# Patient Record
Sex: Male | Born: 1990 | Hispanic: No | Marital: Married | Smoking: Never smoker
Health system: Southern US, Community
[De-identification: ages and names within clinical notes are randomized; demographics above are authoritative.]

## PROBLEM LIST (undated history)

## (undated) DIAGNOSIS — L309 Dermatitis, unspecified: Secondary | ICD-10-CM

## (undated) DIAGNOSIS — Q892 Congenital malformations of other endocrine glands: Secondary | ICD-10-CM

## (undated) DIAGNOSIS — M279 Disease of jaws, unspecified: Secondary | ICD-10-CM

## (undated) HISTORY — PX: NO PAST SURGERIES: SHX2092

---

## 2017-04-16 ENCOUNTER — Emergency Department (HOSPITAL_COMMUNITY)
Admission: EM | Admit: 2017-04-16 | Discharge: 2017-04-16 | Disposition: A | Discharge status: Home / Self Care | Payer: Medicaid Other | Attending: Emergency Medicine | Admitting: Emergency Medicine

## 2017-04-16 ENCOUNTER — Encounter (HOSPITAL_COMMUNITY): Payer: Self-pay | Admitting: *Deleted

## 2017-04-16 ENCOUNTER — Other Ambulatory Visit: Payer: Self-pay

## 2017-04-16 DIAGNOSIS — H66011 Acute suppurative otitis media with spontaneous rupture of ear drum, right ear: Secondary | ICD-10-CM | POA: Diagnosis not present

## 2017-04-16 DIAGNOSIS — H66001 Acute suppurative otitis media without spontaneous rupture of ear drum, right ear: Secondary | ICD-10-CM

## 2017-04-16 DIAGNOSIS — H9201 Otalgia, right ear: Secondary | ICD-10-CM

## 2017-04-16 MED ORDER — IBUPROFEN 800 MG PO TABS
800.0000 mg | ORAL_TABLET | Freq: Once | ORAL | Status: AC
  Administered 2017-04-16: 800 mg via ORAL
  Filled 2017-04-16: qty 1

## 2017-04-16 MED ORDER — AMOXICILLIN 500 MG PO CAPS: 500.0000 mg | ORAL_CAPSULE | Freq: Three times a day (TID) | ORAL | 0 refills | Status: DC

## 2017-04-16 MED ORDER — AMOXICILLIN 500 MG PO CAPS
500.0000 mg | ORAL_CAPSULE | Freq: Once | ORAL | Status: AC
  Administered 2017-04-16: 500 mg via ORAL
  Filled 2017-04-16: qty 1

## 2017-04-16 NOTE — Discharge Instructions (Signed)
1. Medications: Amoxicillin, usual home medications 2. Treatment: rest, drink plenty of fluids, alternate tylenol and ibuprofen for pain control 3. Follow Up: Please followup with your primary doctor in 2-3 days for discussion of your diagnoses and further evaluation after today's visit; if you do not have a primary care doctor use the resource guide provided to find one; Please return to the ER for worsening pain, bleeding from the ear, high fevers or other concerns

## 2017-04-16 NOTE — ED Notes (Signed)
Pt understood dc material. NAD noted. Script and work excuse given at dc 

## 2017-04-16 NOTE — ED Triage Notes (Signed)
Rt earache since last pm  No known injury

## 2017-04-16 NOTE — ED Provider Notes (Signed)
[facility] EMERGENCY [facility]PARTMENT Provider Note   CSN: [id number] Arrival date & time: 04/16/17  0155     History   Chief Complaint Chief Complaint  Patient presents with  . Otalgia    HPI Paul Nicholson is a 26 y.o. male with a hx of no major medical problems presents to the Emergency Department complaining of gradual, persistent, progressively worsening R otalgia onset several hours prior to arrival.  Pt denies fever, chills, neck pain, neck stiffness, rash, lymphadenopathy, nausea, vomiting, vision changes, dental pain.  Associated symptoms include right sided headache. No treatments PTA.  Pt denies hx of HIV or diabetes.  Denies recent URI symptoms.    The history is provided by the patient and medical records. No language interpreter was used.    History reviewed. No pertinent past medical history.  There are no active problems to display for this patient.   History reviewed. No pertinent surgical history.     Home Medications    Prior to Admission medications   Medication Sig Start Date End Date Taking? Authorizing Provider  amoxicillin (AMOXIL) 500 MG capsule Take 1 capsule (500 mg total) by mouth 3 (three) times daily. 04/16/17   Isacc Turney, Dahlia ClientHannah, PA-C    Family History No family history on file.  Social History Social History   Tobacco Use  . Smoking status: Never Smoker  . Smokeless tobacco: Never Used  Substance Use Topics  . Alcohol use: No    Frequency: Never  . Drug use: Not on file     Allergies   Patient has no known allergies.   Review of Systems Review of Systems  Constitutional: Positive for chills. Negative for fatigue and fever.  HENT: Positive for ear pain. Negative for congestion, facial swelling, postnasal drip, rhinorrhea, sinus pain and sneezing.   Eyes: Negative for visual disturbance.  Respiratory: Negative for cough, chest tightness and shortness of breath.   Cardiovascular: Negative for chest pain.    Gastrointestinal: Negative for nausea and vomiting.  Musculoskeletal: Negative for neck pain and neck stiffness.  Skin: Negative for rash.  Allergic/Immunologic: Negative for immunocompromised state.  Neurological: Negative for facial asymmetry.  Hematological: Negative for adenopathy.  Psychiatric/Behavioral: Negative for confusion.     Physical Exam Updated Vital Signs BP (!) 149/95 (BP Location: Right Arm)   Pulse (!) 44   Temp 98.1 F (36.7 C) (Oral)   Resp 18   Ht 5\' 7"  (1.702 m)   Wt 99.8 kg (220 lb)   SpO2 100%   BMI 34.46 kg/m   Physical Exam  Constitutional: He appears well-developed and well-nourished. No distress.  HENT:  Head: Normocephalic and atraumatic.  Right Ear: External ear and ear canal normal. Tympanic membrane is injected, erythematous and bulging. A middle ear effusion is present. No hemotympanum.  Left Ear: Tympanic membrane, external ear and ear canal normal. Tympanic membrane is not injected, not erythematous and not bulging.  No middle ear effusion.  Nose: Mucosal edema and rhinorrhea present. No epistaxis. Right sinus exhibits no maxillary sinus tenderness and no frontal sinus tenderness. Left sinus exhibits no maxillary sinus tenderness and no frontal sinus tenderness.  Mouth/Throat: Uvula is midline and mucous membranes are normal. Mucous membranes are not pale and not cyanotic. No oropharyngeal exudate, posterior oropharyngeal edema, posterior oropharyngeal erythema or tonsillar abscesses.  Eyes: Conjunctivae are normal. Pupils are equal, round, and reactive to light.  Neck: Normal range of motion and full passive range of motion without pain.  Cardiovascular: Normal rate  and intact distal pulses.  Pulmonary/Chest: Effort normal and breath sounds normal. No stridor.  Clear and equal breath sounds without focal wheezes, rhonchi, rales  Abdominal: Soft. There is no tenderness.  Musculoskeletal: Normal range of motion.  Lymphadenopathy:    He has  no cervical adenopathy.  Neurological: He is alert.  Skin: Skin is warm and dry. No rash noted. He is not diaphoretic.  Psychiatric: He has a normal mood and affect.  Nursing note and vitals reviewed.    ED Treatments / Results   Procedures Procedures (including critical care time)  Medications Ordered in ED Medications  amoxicillin (AMOXIL) capsule 500 mg (500 mg Oral Given 04/16/17 0353)  ibuprofen (ADVIL,MOTRIN) tablet 800 mg (800 mg Oral Given 04/16/17 0353)     Initial Impression / Assessment and Plan / ED Course  I have reviewed the triage vital signs and the nursing notes.  Pertinent labs & imaging results that were available during my care of the patient were reviewed by me and considered in my medical decision making (see chart for details).     Patient presents with otalgia and exam consistent with acute otitis media. No concern for acute mastoiditis, meningitis.  No fever or signs of sepsis.  Advised patient to call PCP today for follow-up.  Will give amoxicillin.  I have also discussed reasons to return immediately to the ER.  Patient expresses understanding and agrees with plan.     Final Clinical Impressions(s) / ED Diagnoses   Final diagnoses:  Right ear pain  Acute suppurative otitis media of right ear without spontaneous rupture of tympanic membrane, recurrence not specified    ED Discharge Orders        Ordered    amoxicillin (AMOXIL) 500 MG capsule  3 times daily     04/16/17 0347       Anhad Sheeley, Dahlia ClientHannah, PA-C 04/16/17 0358    Gwyneth SproutPlunkett, Whitney, MD 04/16/17 [phone]

## 2017-04-26 ENCOUNTER — Emergency Department (HOSPITAL_COMMUNITY)
Admission: EM | Admit: 2017-04-26 | Discharge: 2017-04-26 | Disposition: A | Discharge status: Home / Self Care | Payer: Medicaid Other | Attending: Emergency Medicine | Admitting: Emergency Medicine

## 2017-04-26 ENCOUNTER — Encounter (HOSPITAL_COMMUNITY): Payer: Self-pay | Admitting: Emergency Medicine

## 2017-04-26 DIAGNOSIS — J029 Acute pharyngitis, unspecified: Secondary | ICD-10-CM | POA: Insufficient documentation

## 2017-04-26 LAB — RAPID STREP SCREEN (MED CTR MEBANE ONLY): STREPTOCOCCUS, GROUP A SCREEN (DIRECT): NEGATIVE

## 2017-04-26 MED ORDER — DEXAMETHASONE 1 MG/ML PO CONC
10.0000 mg | Freq: Once | ORAL | Status: AC
  Administered 2017-04-26: 10 mg via ORAL
  Filled 2017-04-26: qty 10

## 2017-04-26 MED ORDER — HYDROCODONE-ACETAMINOPHEN 7.5-325 MG/15ML PO SOLN: 10.0000 mL | Freq: Four times a day (QID) | ORAL | 0 refills | Status: DC | PRN

## 2017-04-26 MED ORDER — ACETAMINOPHEN 325 MG PO TABS
650.0000 mg | ORAL_TABLET | Freq: Once | ORAL | Status: AC | PRN
  Administered 2017-04-26: 650 mg via ORAL
  Filled 2017-04-26: qty 2

## 2017-04-26 NOTE — ED Notes (Signed)
ED Provider at bedside. 

## 2017-04-26 NOTE — Discharge Instructions (Signed)
Take pain medicine as needed Take benadryl if medicine makes you itchy Return if worsening

## 2017-04-26 NOTE — ED Notes (Signed)
Pt not in lobby when called to room.  

## 2017-04-26 NOTE — ED Provider Notes (Signed)
[facility] Note   CSN: [id number] Arrival date & time: 04/26/17  2021     History   Chief Complaint Chief Complaint  Patient presents with  . Sore Throat    HPI [name] is a 26 y.o. male who presents with a sore throat. He states that ~10 days ago he was treated for an ear infection. His symptoms initially improved after treatment. Today however, he started to have a sore throat. The pain has worsened throughout the day so he returned to make sure he didn't have strep throat. He states he still has a "squishy" feeling in his R ear and some L ear pressure as well. He also feels pain in the front of his throat, above his adam's apple, and behind his head. He denies runny nose, cough, abdominal pain. He denies sick contacts. He has not had a fever since he was initially diagnosed with an ear infection.  HPI  History reviewed. No pertinent past medical history.  There are no active problems to display for this patient.   History reviewed. No pertinent surgical history.     Home Medications    Prior to Admission medications   Medication Sig Start Date End Date Taking? Authorizing Provider  amoxicillin (AMOXIL) 500 MG capsule Take 1 capsule (500 mg total) by mouth 3 (three) times daily. 04/16/17   Muthersbaugh, Dahlia ClientHannah, PA-C    Family History History reviewed. No pertinent family history.  Social History Social History   Tobacco Use  . Smoking status: Never Smoker  . Smokeless tobacco: Never Used  Substance Use Topics  . Alcohol use: No    Frequency: Never  . Drug use: Not on file     Allergies   Oxycodone   Review of Systems Review of Systems  Constitutional: Negative for chills and fever.  HENT: Positive for ear pain and sore throat. Negative for congestion, ear discharge and rhinorrhea.   Respiratory: Negative for cough.   Gastrointestinal: Negative for abdominal pain.     Physical Exam Updated  Vital Signs BP (!) 144/94 (BP Location: Right Arm)   Pulse 81   Temp 99.7 F (37.6 C) (Oral)   Resp 18   SpO2 100%   Physical Exam  Constitutional: He is oriented to person, place, and time. He appears well-developed and well-nourished. No distress.  HENT:  Head: Normocephalic and atraumatic.  Right Ear: Hearing, external ear and ear canal normal. A middle ear effusion is present.  Left Ear: Hearing, tympanic membrane, external ear and ear canal normal.  Nose: Nose normal.  Mouth/Throat: Uvula is midline, oropharynx is clear and moist and mucous membranes are normal. No posterior oropharyngeal edema or posterior oropharyngeal erythema. No tonsillar exudate.  Eyes: Conjunctivae are normal. Pupils are equal, round, and reactive to light. Right eye exhibits no discharge. Left eye exhibits no discharge. No scleral icterus.  Neck: Normal range of motion.  Tender posterior cervical lymph nodes  Cardiovascular: Normal rate and regular rhythm. Exam reveals no gallop and no friction rub.  No murmur heard. Pulmonary/Chest: Effort normal. No stridor. No respiratory distress. He has no wheezes. He has no rales. He exhibits no tenderness.  Abdominal: He exhibits no distension.  Lymphadenopathy:    He has cervical adenopathy (bilateral).  Neurological: He is alert and oriented to person, place, and time.  Skin: Skin is warm and dry.  Psychiatric: He has a normal mood and affect. His behavior is normal.  Nursing note and vitals reviewed.  ED Treatments / Results  Labs (all labs ordered are listed, but only abnormal results are displayed) Labs Reviewed  RAPID STREP SCREEN (NOT AT [facility])  CULTURE, GROUP A STREP [facility]    EKG  EKG Interpretation None       Radiology No results found.  Procedures Procedures (including critical care time)  Medications Ordered in ED Medications  acetaminophen (TYLENOL) tablet 650 mg (650 mg Oral Given 04/26/17 2042)  dexamethasone (DECADRON) 1  MG/ML solution 10 mg (10 mg Oral Given 04/26/17 2319)     Initial Impression / Assessment and Plan / ED Course  I have reviewed the triage vital signs and the nursing notes.  Pertinent labs & imaging results that were available during my care of the patient were reviewed by me and considered in my medical decision making (see chart for details).  26 year old male presents with pharyngitis. Temp is slightly elevated and he is mildly hypertensive. He is not ill appearing but does seem to have discomfort. Exam is overall unremarkable. No evidence of PTA on exam. Rapid strep is negative. He was given a dose of Decadron and rx for Hycet. He was advised to return for worsening symptoms. He verbalized understanding.  Final Clinical Impressions(s) / ED Diagnoses   Final diagnoses:  Pharyngitis, unspecified etiology    ED Discharge Orders    None       Bethel BornGekas, Jesslynn Kruck Marie, PA-C 04/26/17 2355    Derwood KaplanNanavati, Ankit, MD 04/27/17 1459

## 2017-04-26 NOTE — ED Triage Notes (Signed)
Pt presents to ED for assessment of right throat pain, facial and throat swelling, and ear pain.  Pt states he was treated for an ear infection approx 2 weeks ago, completed all ABX.  Patient states too painful to swallow at this point.

## 2017-04-29 LAB — CULTURE, GROUP A STREP (THRC)

## 2017-05-11 ENCOUNTER — Other Ambulatory Visit: Payer: Self-pay

## 2017-05-11 ENCOUNTER — Encounter (HOSPITAL_COMMUNITY): Payer: Self-pay | Admitting: Emergency Medicine

## 2017-05-11 DIAGNOSIS — Z79899 Other long term (current) drug therapy: Secondary | ICD-10-CM | POA: Insufficient documentation

## 2017-05-11 DIAGNOSIS — R1310 Dysphagia, unspecified: Secondary | ICD-10-CM | POA: Diagnosis not present

## 2017-05-11 NOTE — ED Triage Notes (Signed)
Seen two weeks ago for a sore throat and swelling.  Reports sore throat is gone but still has a knot that you can feel as well as difficulty swallowing.

## 2017-05-12 ENCOUNTER — Emergency Department (HOSPITAL_COMMUNITY): Payer: Medicaid Other

## 2017-05-12 ENCOUNTER — Encounter (HOSPITAL_COMMUNITY): Payer: Self-pay | Admitting: Radiology

## 2017-05-12 ENCOUNTER — Emergency Department (HOSPITAL_COMMUNITY)
Admission: EM | Admit: 2017-05-12 | Discharge: 2017-05-12 | Disposition: A | Discharge status: Home / Self Care | Payer: Medicaid Other | Attending: Emergency Medicine | Admitting: Emergency Medicine

## 2017-05-12 DIAGNOSIS — R1310 Dysphagia, unspecified: Secondary | ICD-10-CM

## 2017-05-12 LAB — BASIC METABOLIC PANEL
Anion gap: 6 (ref 5–15)
BUN: 10 mg/dL (ref 6–20)
CHLORIDE: 104 mmol/L (ref 101–111)
CO2: 27 mmol/L (ref 22–32)
Calcium: 9.1 mg/dL (ref 8.9–10.3)
Creatinine, Ser: 0.89 mg/dL (ref 0.61–1.24)
GFR calc Af Amer: >60 mL/min (ref >60)
GFR calc non Af Amer: >60 mL/min (ref >60)
GLUCOSE: 106 mg/dL — AB (ref 65–99)
POTASSIUM: 3.8 mmol/L (ref 3.5–5.1)
Sodium: 137 mmol/L (ref 135–145)

## 2017-05-12 LAB — CBC WITH DIFFERENTIAL/PLATELET
Basophils Absolute: 0 10*3/uL (ref 0.0–0.1)
Basophils Relative: 0 %
EOS PCT: 5 %
Eosinophils Absolute: 0.2 10*3/uL (ref 0.0–0.7)
HCT: 41 % (ref 39.0–52.0)
HEMOGLOBIN: 13.3 g/dL (ref 13.0–17.0)
LYMPHS ABS: 2.3 10*3/uL (ref 0.7–4.0)
LYMPHS PCT: 49 %
MCH: 27.4 pg (ref 26.0–34.0)
MCHC: 32.4 g/dL (ref 30.0–36.0)
MCV: 84.5 fL (ref 78.0–100.0)
Monocytes Absolute: 0.4 10*3/uL (ref 0.1–1.0)
Monocytes Relative: 8 %
Neutro Abs: 1.8 10*3/uL (ref 1.7–7.7)
Neutrophils Relative %: 38 %
PLATELETS: 291 10*3/uL (ref 150–400)
RBC: 4.85 MIL/uL (ref 4.22–5.81)
RDW: 13.4 % (ref 11.5–15.5)
WBC: 4.6 10*3/uL (ref 4.0–10.5)

## 2017-05-12 MED ORDER — CLINDAMYCIN HCL 150 MG PO CAPS: 300.0000 mg | ORAL_CAPSULE | Freq: Four times a day (QID) | ORAL | 0 refills | Status: DC

## 2017-05-12 MED ORDER — IOPAMIDOL (ISOVUE-300) INJECTION 61%
INTRAVENOUS | Status: AC
  Administered 2017-05-12: 75 mL
  Filled 2017-05-12: qty 75
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
## 2017-05-12 NOTE — Discharge Instructions (Signed)
It was my pleasure taking care of you today!   Please take all of your antibiotics until finished!  Please call the ENT doctor listed (ear, nose and throat) first thing Monday morning to schedule a follow up appointment this week.   Return to ER for fever, inability to keep food/fluids down, new or worsening symptoms, any additional concerns.

## 2017-05-15 DIAGNOSIS — Q892 Congenital malformations of other endocrine glands: Secondary | ICD-10-CM

## 2017-05-15 HISTORY — DX: Congenital malformations of other endocrine glands: Q89.2

## 2017-05-17 ENCOUNTER — Ambulatory Visit (INDEPENDENT_AMBULATORY_CARE_PROVIDER_SITE_OTHER): Payer: Medicaid Other | Admitting: Otolaryngology

## 2017-05-17 DIAGNOSIS — R070 Pain in throat: Secondary | ICD-10-CM | POA: Diagnosis not present

## 2017-05-17 DIAGNOSIS — Q892 Congenital malformations of other endocrine glands: Secondary | ICD-10-CM | POA: Diagnosis not present

## 2017-05-18 ENCOUNTER — Ambulatory Visit: Payer: Medicaid Other | Admitting: Family Medicine

## 2017-05-18 ENCOUNTER — Encounter (HOSPITAL_BASED_OUTPATIENT_CLINIC_OR_DEPARTMENT_OTHER): Payer: Self-pay | Admitting: *Deleted

## 2017-05-18 ENCOUNTER — Other Ambulatory Visit: Payer: Self-pay

## 2017-05-21 ENCOUNTER — Other Ambulatory Visit: Payer: Self-pay | Admitting: Otolaryngology

## 2017-05-22 ENCOUNTER — Ambulatory Visit (HOSPITAL_BASED_OUTPATIENT_CLINIC_OR_DEPARTMENT_OTHER)
Admission: RE | Admit: 2017-05-22 | Discharge: 2017-05-22 | Disposition: A | Discharge status: Home / Self Care | Payer: Medicaid Other | Source: Ambulatory Visit | Attending: Otolaryngology | Admitting: Otolaryngology

## 2017-05-22 ENCOUNTER — Other Ambulatory Visit: Payer: Self-pay

## 2017-05-22 ENCOUNTER — Ambulatory Visit (HOSPITAL_BASED_OUTPATIENT_CLINIC_OR_DEPARTMENT_OTHER): Payer: Medicaid Other | Admitting: Anesthesiology

## 2017-05-22 ENCOUNTER — Encounter (HOSPITAL_BASED_OUTPATIENT_CLINIC_OR_DEPARTMENT_OTHER): Payer: Self-pay

## 2017-05-22 ENCOUNTER — Encounter (HOSPITAL_BASED_OUTPATIENT_CLINIC_OR_DEPARTMENT_OTHER)
Admission: RE | Disposition: A | Discharge status: Home / Self Care | Payer: Self-pay | Source: Ambulatory Visit | Attending: Otolaryngology

## 2017-05-22 DIAGNOSIS — E669 Obesity, unspecified: Secondary | ICD-10-CM | POA: Insufficient documentation

## 2017-05-22 DIAGNOSIS — Z6836 Body mass index (BMI) 36.0-36.9, adult: Secondary | ICD-10-CM | POA: Diagnosis not present

## 2017-05-22 DIAGNOSIS — Q892 Congenital malformations of other endocrine glands: Secondary | ICD-10-CM | POA: Insufficient documentation

## 2017-05-22 HISTORY — DX: Disease of jaws, unspecified: M27.9

## 2017-05-22 HISTORY — DX: Dermatitis, unspecified: L30.9

## 2017-05-22 HISTORY — PX: THYROGLOSSAL DUCT CYST: SHX297

## 2017-05-22 HISTORY — DX: Congenital malformations of other endocrine glands: Q89.2

## 2017-05-22 SURGERY — EXCISION, THYROGLOSSAL DUCT CYST
Anesthesia: General | Site: Neck

## 2017-05-22 MED ORDER — SUCCINYLCHOLINE CHLORIDE 200 MG/10ML IV SOSY
PREFILLED_SYRINGE | INTRAVENOUS | Status: AC
  Filled 2017-05-22: qty 10

## 2017-05-22 MED ORDER — PHENYLEPHRINE HCL 10 MG/ML IJ SOLN
INTRAMUSCULAR | Status: DC | PRN
  Administered 2017-05-22: 80 ug via INTRAVENOUS
  Administered 2017-05-22 (×2): 40 ug via INTRAVENOUS
  Administered 2017-05-22: 80 ug via INTRAVENOUS

## 2017-05-22 MED ORDER — ONDANSETRON HCL 4 MG/2ML IJ SOLN
INTRAMUSCULAR | Status: DC | PRN
  Administered 2017-05-22: 4 mg via INTRAVENOUS

## 2017-05-22 MED ORDER — EPHEDRINE 5 MG/ML INJ
INTRAVENOUS | Status: AC
  Filled 2017-05-22: qty 10

## 2017-05-22 MED ORDER — SCOPOLAMINE 1 MG/3DAYS TD PT72: 1.0000 | MEDICATED_PATCH | Freq: Once | TRANSDERMAL | Status: DC | PRN

## 2017-05-22 MED ORDER — LACTATED RINGERS IV SOLN
INTRAVENOUS | Status: DC
  Administered 2017-05-22 (×3): via INTRAVENOUS

## 2017-05-22 MED ORDER — CEFAZOLIN SODIUM-DEXTROSE 2-4 GM/100ML-% IV SOLN
INTRAVENOUS | Status: AC
  Filled 2017-05-22: qty 100

## 2017-05-22 MED ORDER — FENTANYL CITRATE (PF) 100 MCG/2ML IJ SOLN
INTRAMUSCULAR | Status: AC
  Filled 2017-05-22: qty 2

## 2017-05-22 MED ORDER — CEFAZOLIN SODIUM-DEXTROSE 2-3 GM-%(50ML) IV SOLR
INTRAVENOUS | Status: DC | PRN
  Administered 2017-05-22: 2 g via INTRAVENOUS

## 2017-05-22 MED ORDER — ESMOLOL HCL 100 MG/10ML IV SOLN
INTRAVENOUS | Status: AC
  Filled 2017-05-22: qty 10

## 2017-05-22 MED ORDER — DEXAMETHASONE SODIUM PHOSPHATE 10 MG/ML IJ SOLN
INTRAMUSCULAR | Status: AC
  Filled 2017-05-22: qty 1

## 2017-05-22 MED ORDER — MIDAZOLAM HCL 2 MG/2ML IJ SOLN
INTRAMUSCULAR | Status: AC
  Filled 2017-05-22: qty 2

## 2017-05-22 MED ORDER — SUCCINYLCHOLINE CHLORIDE 20 MG/ML IJ SOLN
INTRAMUSCULAR | Status: DC | PRN
  Administered 2017-05-22: 100 mg via INTRAVENOUS

## 2017-05-22 MED ORDER — LIDOCAINE-EPINEPHRINE 1 %-1:100000 IJ SOLN
INTRAMUSCULAR | Status: DC | PRN
  Administered 2017-05-22: 2 mL

## 2017-05-22 MED ORDER — EPHEDRINE SULFATE-NACL 50-0.9 MG/10ML-% IV SOSY
PREFILLED_SYRINGE | INTRAVENOUS | Status: DC | PRN
  Administered 2017-05-22 (×2): 5 mg via INTRAVENOUS

## 2017-05-22 MED ORDER — PROPOFOL 10 MG/ML IV BOLUS
INTRAVENOUS | Status: AC
  Filled 2017-05-22: qty 20

## 2017-05-22 MED ORDER — ONDANSETRON HCL 4 MG/2ML IJ SOLN
INTRAMUSCULAR | Status: AC
  Filled 2017-05-22: qty 4

## 2017-05-22 MED ORDER — PROMETHAZINE HCL 25 MG/ML IJ SOLN: 6.2500 mg | INTRAMUSCULAR | Status: DC | PRN

## 2017-05-22 MED ORDER — ONDANSETRON HCL 4 MG/2ML IJ SOLN
INTRAMUSCULAR | Status: AC
  Filled 2017-05-22: qty 2

## 2017-05-22 MED ORDER — ACETAMINOPHEN 500 MG PO TABS
1000.0000 mg | ORAL_TABLET | Freq: Once | ORAL | Status: AC
  Administered 2017-05-22: 1000 mg via ORAL

## 2017-05-22 MED ORDER — FENTANYL CITRATE (PF) 100 MCG/2ML IJ SOLN
25.0000 ug | INTRAMUSCULAR | Status: DC | PRN
  Administered 2017-05-22: 25 ug via INTRAVENOUS
  Administered 2017-05-22: 50 ug via INTRAVENOUS
  Administered 2017-05-22: 25 ug via INTRAVENOUS

## 2017-05-22 MED ORDER — PROPOFOL 10 MG/ML IV BOLUS
INTRAVENOUS | Status: DC | PRN
  Administered 2017-05-22: 200 mg via INTRAVENOUS

## 2017-05-22 MED ORDER — MIDAZOLAM HCL 2 MG/2ML IJ SOLN
1.0000 mg | INTRAMUSCULAR | Status: DC | PRN
  Administered 2017-05-22: 2 mg via INTRAVENOUS

## 2017-05-22 MED ORDER — DEXAMETHASONE SODIUM PHOSPHATE 4 MG/ML IJ SOLN
INTRAMUSCULAR | Status: DC | PRN
  Administered 2017-05-22: 10 mg via INTRAVENOUS

## 2017-05-22 MED ORDER — HYDROCODONE-ACETAMINOPHEN 5-300 MG PO TABS: 1.0000 | ORAL_TABLET | ORAL | 0 refills | Status: DC | PRN

## 2017-05-22 MED ORDER — LIDOCAINE 2% (20 MG/ML) 5 ML SYRINGE
INTRAMUSCULAR | Status: AC
  Filled 2017-05-22: qty 5

## 2017-05-22 MED ORDER — ROCURONIUM BROMIDE 10 MG/ML (PF) SYRINGE
PREFILLED_SYRINGE | INTRAVENOUS | Status: AC
  Filled 2017-05-22: qty 5

## 2017-05-22 MED ORDER — KETOROLAC TROMETHAMINE 30 MG/ML IJ SOLN
INTRAMUSCULAR | Status: AC
  Filled 2017-05-22: qty 1

## 2017-05-22 MED ORDER — EPHEDRINE 5 MG/ML INJ
INTRAVENOUS | Status: AC
  Filled 2017-05-22: qty 40

## 2017-05-22 MED ORDER — ACETAMINOPHEN 500 MG PO TABS
ORAL_TABLET | ORAL | Status: AC
  Filled 2017-05-22: qty 1

## 2017-05-22 MED ORDER — LIDOCAINE 2% (20 MG/ML) 5 ML SYRINGE
INTRAMUSCULAR | Status: DC | PRN
  Administered 2017-05-22: 60 mg via INTRAVENOUS

## 2017-05-22 MED ORDER — FENTANYL CITRATE (PF) 100 MCG/2ML IJ SOLN
50.0000 ug | INTRAMUSCULAR | Status: DC | PRN
  Administered 2017-05-22: 100 ug via INTRAVENOUS
  Administered 2017-05-22: 50 ug via INTRAVENOUS

## 2017-05-22 SURGICAL SUPPLY — 63 items
BLADE SURG 15 STRL LF DISP TIS (BLADE) ×1 IMPLANT
BLADE SURG 15 STRL SS (BLADE) ×2
CANISTER SUCT 1200ML W/VALVE (MISCELLANEOUS) ×3 IMPLANT
CLIP VESOCCLUDE MED 6/CT (CLIP) IMPLANT
CLIP VESOCCLUDE SM WIDE 6/CT (CLIP) IMPLANT
CORD BIPOLAR FORCEPS 12FT (ELECTRODE) ×6 IMPLANT
COVER BACK TABLE 60X90IN (DRAPES) ×3 IMPLANT
COVER MAYO STAND STRL (DRAPES) ×3 IMPLANT
DECANTER SPIKE VIAL GLASS SM (MISCELLANEOUS) IMPLANT
DERMABOND ADVANCED (GAUZE/BANDAGES/DRESSINGS) ×2
DERMABOND ADVANCED .7 DNX12 (GAUZE/BANDAGES/DRESSINGS) ×1 IMPLANT
DRAIN PENROSE 1/4X12 LTX STRL (WOUND CARE) IMPLANT
DRAIN TLS ROUND 10FR (DRAIN) IMPLANT
DRAPE U-SHAPE 76X120 STRL (DRAPES) ×3 IMPLANT
ELECT COATED BLADE 2.86 ST (ELECTRODE) ×3 IMPLANT
ELECT NEEDLE BLADE 2-5/6 (NEEDLE) IMPLANT
ELECT REM PT RETURN 9FT ADLT (ELECTROSURGICAL) ×3
ELECTRODE REM PT RTRN 9FT ADLT (ELECTROSURGICAL) ×1 IMPLANT
FORCEPS BIPOLAR SPETZLER 8 1.0 (NEUROSURGERY SUPPLIES) IMPLANT
GAUZE SPONGE 4X4 12PLY STRL LF (GAUZE/BANDAGES/DRESSINGS) IMPLANT
GAUZE SPONGE 4X4 16PLY XRAY LF (GAUZE/BANDAGES/DRESSINGS) ×3 IMPLANT
GLOVE BIO SURGEON STRL SZ 6.5 (GLOVE) ×2 IMPLANT
GLOVE BIO SURGEON STRL SZ7.5 (GLOVE) ×3 IMPLANT
GLOVE BIO SURGEONS STRL SZ 6.5 (GLOVE) ×1
GLOVE EXAM NITRILE MD LF STRL (GLOVE) ×3 IMPLANT
GOWN STRL REUS W/ TWL LRG LVL3 (GOWN DISPOSABLE) ×2 IMPLANT
GOWN STRL REUS W/TWL LRG LVL3 (GOWN DISPOSABLE) ×4
HEMOSTAT SNOW SURGICEL 2X4 (HEMOSTASIS) ×3 IMPLANT
HEMOSTAT SURGICEL .5X2 ABSORB (HEMOSTASIS) IMPLANT
NEEDLE HYPO 25X1 1.5 SAFETY (NEEDLE) ×3 IMPLANT
NS IRRIG 1000ML POUR BTL (IV SOLUTION) ×3 IMPLANT
PACK BASIN DAY SURGERY FS (CUSTOM PROCEDURE TRAY) ×3 IMPLANT
PENCIL BUTTON HOLSTER BLD 10FT (ELECTRODE) ×3 IMPLANT
PIN SAFETY STERILE (MISCELLANEOUS) IMPLANT
SHEARS HARMONIC 9CM CVD (BLADE) ×3 IMPLANT
SLEEVE SCD COMPRESS KNEE MED (MISCELLANEOUS) ×3 IMPLANT
SPONGE GAUZE 2X2 8PLY STER LF (GAUZE/BANDAGES/DRESSINGS)
SPONGE GAUZE 2X2 8PLY STRL LF (GAUZE/BANDAGES/DRESSINGS) IMPLANT
SUCTION FRAZIER HANDLE 10FR (MISCELLANEOUS) ×2
SUCTION TUBE FRAZIER 10FR DISP (MISCELLANEOUS) ×1 IMPLANT
SUT BONE WAX W31G (SUTURE) ×3 IMPLANT
SUT ETHILON 3 0 PS 1 (SUTURE) IMPLANT
SUT ETHILON 4 0 PS 2 18 (SUTURE) IMPLANT
SUT ETHILON 5 0 P 3 18 (SUTURE)
SUT NYLON ETHILON 5-0 P-3 1X18 (SUTURE) IMPLANT
SUT SILK 3 0 PS 1 (SUTURE) IMPLANT
SUT SILK 3 0 TIES 17X18 (SUTURE)
SUT SILK 3-0 18XBRD TIE BLK (SUTURE) IMPLANT
SUT SILK 4 0 TIES 17X18 (SUTURE) ×3 IMPLANT
SUT VIC AB 3-0 FS2 27 (SUTURE) IMPLANT
SUT VIC AB 4-0 P-3 18XBRD (SUTURE) IMPLANT
SUT VIC AB 4-0 P3 18 (SUTURE)
SUT VIC AB 4-0 RB1 27 (SUTURE)
SUT VIC AB 4-0 RB1 27X BRD (SUTURE) IMPLANT
SUT VICRYL 4-0 PS2 18IN ABS (SUTURE) ×3 IMPLANT
SWAB COLLECTION DEVICE MRSA (MISCELLANEOUS) IMPLANT
SWAB CULTURE ESWAB REG 1ML (MISCELLANEOUS) IMPLANT
SYR BULB 3OZ (MISCELLANEOUS) ×3 IMPLANT
SYR CONTROL 10ML LL (SYRINGE) ×3 IMPLANT
TOWEL OR 17X24 6PK STRL BLUE (TOWEL DISPOSABLE) ×6 IMPLANT
TRAY DSU PREP LF (CUSTOM PROCEDURE TRAY) ×3 IMPLANT
TUBE CONNECTING 20'X1/4 (TUBING) ×1
TUBE CONNECTING 20X1/4 (TUBING) ×2 IMPLANT

## 2017-05-22 NOTE — Anesthesia Preprocedure Evaluation (Addendum)
Anesthesia Evaluation  Patient identified by MRN, date of birth, ID band Patient awake    Reviewed: Allergy & Precautions, NPO status , Patient's Chart, lab work & pertinent test results  Airway Mallampati: I  TM Distance: >3 FB Neck ROM: Full    Dental  (+) Teeth Intact, Dental Advisory Given   Pulmonary neg pulmonary ROS,    Pulmonary exam normal breath sounds clear to auscultation       Cardiovascular Exercise Tolerance: Good negative cardio ROS Normal cardiovascular exam Rhythm:Regular Rate:Normal     Neuro/Psych negative neurological ROS  negative psych ROS   GI/Hepatic negative GI ROS, Neg liver ROS,   Endo/Other  Obesity   Renal/GU negative Renal ROS     Musculoskeletal negative musculoskeletal ROS (+)   Abdominal   Peds  Hematology negative hematology ROS (+)   Anesthesia Other Findings Day of surgery medications reviewed with the patient.  Reproductive/Obstetrics                            Anesthesia Physical Anesthesia Plan  ASA: II  Anesthesia Plan: General   Post-op Pain Management:    Induction: Intravenous  PONV Risk Score and Plan: 3 and Midazolam, Dexamethasone and Ondansetron  Airway Management Planned: Oral ETT  Additional Equipment:   Intra-op Plan:   Post-operative Plan: Extubation in OR  Informed Consent: I have reviewed the patients History and Physical, chart, labs and discussed the procedure including the risks, benefits and alternatives for the proposed anesthesia with the patient or authorized representative who has indicated his/her understanding and acceptance.   Dental advisory given  Plan Discussed with: CRNA  Anesthesia Plan Comments: (Risks/benefits of general anesthesia discussed with patient including risk of damage to teeth, lips, gum, and tongue, nausea/vomiting, allergic reactions to medications, and the possibility of heart attack,  stroke and death.  All patient questions answered.  Patient wishes to proceed.)        Anesthesia Quick Evaluation

## 2017-05-22 NOTE — Discharge Instructions (Addendum)
Thyroglossal Cyst Removal, Care After Refer to this sheet in the next few weeks. These instructions provide you with information about caring for yourself after your procedure. Your health care provider may also give you more specific instructions. Your treatment has been planned according to current medical practices, but problems sometimes occur. Call your health care provider if you have any problems or questions after your procedure. What can I expect after the procedure? After the procedure, it is common to have:  Pain, swelling, and soreness in your throat.  Difficulty swallowing.  A hoarse voice.  Blood in your saliva for a few days.  Follow these instructions at home: Medicines  Take over-the-counter and prescription medicines only as told by your health care provider.   Incision care   Follow instructions from your health care provider about how to take care of your incision. Make sure you: ? Wash your hands with soap and water before you change your bandage (dressing). If soap and water are not available, use hand sanitizer. ? Change your dressing as told by your health care provider. ? Leave stitches (sutures), skin glue, or adhesive strips in place. These skin closures may need to stay in place for 2 weeks or longer. If adhesive strip edges start to loosen and curl up, you may trim the loose edges. Do not remove adhesive strips completely unless your health care provider tells you to do that.  Check your incision area every day for signs of infection. Check for: ? More redness, swelling, or pain. ? Fluid or blood. ? Warmth. ? Pus or a bad smell. Driving  Do not drive for 24 hours if you received a sedative.  Do not drive or operate heavy machinery while taking prescription pain medicine. General instructions   Do not use any tobacco products, such as cigarettes, chewing tobacco, or e-cigarettes. If you need help quitting, ask your health care provider.  Follow  instructions from your health care provider about eating or drinking restrictions. ? You may be told to eat only liquids for the first day after surgery. ? If swallowing is painful, try eating soft foods until you feel better.  Return to your normal activities as told by your health care provider. Ask your health care provider what activities are safe for you.  Keep all follow-up visits as told by your health care provider. This is important. Contact a health care provider if:  You have pain that gets worse or does not get better with medicine.  You have more redness, swelling, or pain around your incision.  You have fluid or blood coming from your incision.  Your incision feels warm to the touch.  You have pus or a bad smell coming from your incision.  You have hoarseness that does not get better in 7-10 days.  You have difficulty swallowing, and this does not go away after 1 week.  Your cyst grows back.  You have numbness or tingling in your throat or mouth.  You vomit or feel nauseous. Get help right away if:  You have difficulty breathing.  You cannot swallow.  You have severe pain. This information is not intended to replace advice given to you by your health care provider. Make sure you discuss any questions you have with your health care provider. Document Released: 08/26/2010 Document Revised: 01/02/2016 Document Reviewed: 03/14/2015 Elsevier Interactive Patient Education  2018 [employer]   Post Anesthesia Home Care Instructions  Activity: Get plenty of rest for the remainder of the  day. A responsible individual must stay with you for 24 hours following the procedure.  For the next 24 hours, DO NOT: -Drive a car -Advertising copywriter -Drink alcoholic beverages -Take any medication unless instructed by your physician -Make any legal decisions or sign important papers.  Meals: Start with liquid foods such as gelatin or soup. Progress to regular foods as  tolerated. Avoid greasy, spicy, heavy foods. If nausea and/or vomiting occur, drink only clear liquids until the nausea and/or vomiting subsides. Call your physician if vomiting continues.  Special Instructions/Symptoms: Your throat may feel dry or sore from the anesthesia or the breathing tube placed in your throat during surgery. If this causes discomfort, gargle with warm salt water. The discomfort should disappear within 24 hours.  If you had a scopolamine patch placed behind your ear for the management of post- operative nausea and/or vomiting:  1. The medication in the patch is effective for 72 hours, after which it should be removed.  Wrap patch in a tissue and discard in the trash. Wash hands thoroughly with soap and water. 2. You may remove the patch earlier than 72 hours if you experience unpleasant side effects which may include dry mouth, dizziness or visual disturbances. 3. Avoid touching the patch. Wash your hands with soap and water after contact with the patch.

## 2017-05-22 NOTE — Op Note (Signed)
DATE OF PROCEDURE:  05/22/2017                              OPERATIVE REPORT  SURGEON:  Newman PiesSu Jaiveer Panas, MD  PREOPERATIVE DIAGNOSES: 1. Thyroglossal duct cyst  POSTOPERATIVE DIAGNOSES: 1. Thyroglossal duct cyst  PROCEDURE PERFORMED:  Excision of thyroglossal duct cyst and portion of hyoid bone  ANESTHESIA:  General endotracheal tube anesthesia.  COMPLICATIONS:  None.  ESTIMATED BLOOD LOSS:  50 ml.  INDICATION FOR PROCEDURE:  Paul Nicholson is a 27 y.o. male with a history of a midline neck mass. The patient has been experiencing recurrent infections of the neck mass. On his CT scan, the mass was consistent with a thyroglossal duct cyst. Based on the above findings, the decision was made for the patient to undergo the above-stated procedure. Likelihood of success in reducing symptoms was also discussed.  The risks, benefits, alternatives, and details of the procedure were discussed with the patient.  Questions were invited and answered.  Informed consent was obtained.  DESCRIPTION:  The patient was taken to the operating room and placed supine on the operating table.  General endotracheal tube anesthesia was administered by the anesthesiologist.  The patient was positioned and prepped and draped in a standard fashion for thyroglossal duct cyst excision.  1% lidocaine with 1-100,000 epinephrine was infiltrated at the planned site of incision. A horizontal incision was made at the level of the hyoid bone. The incision was carried down to the level of the platysma. Superiorly based and inferiorly based subplatysmal flaps were elevated in a standard fashion. At this point, the patient was noted to have a large 3 cm cystic mass at the level of the hyoid bone. The mass was carefully resected free from the surrounding soft tissue. The mass was noted to be attached to the midportion of the hyoid bone. The midportion of the hyoid bone was also resected with a bone cutter. The entire specimen was sent to the  pathology department for permanent histologic identification.  The surgical site was copiously irrigated. Hemostasis was achieved using a combination of electrocautery devices, bone wax, and Surgicel. The incision was closed in layers with 4-0 Vicryl and Dermabond.  The care of the patient was turned over to the anesthesiologist.  The patient was awakened from anesthesia without difficulty.  The patient was extubated and transferred to the recovery room in good condition.  OPERATIVE FINDINGS:  Thyroglossal duct cyst  SPECIMEN:  Thyroglossal duct cyst and hyoid bone.   FOLLOWUP CARE:  The patient will be discharged home once awake and alert.  He will be placed on Vicodin when necessary for pain. The patient will follow up in my office in approximately 1 week.  [name] 05/22/2017 11:57 AM

## 2017-05-22 NOTE — Anesthesia Procedure Notes (Signed)
Procedure Name: Intubation Date/Time: 05/22/2017 10:01 AM Performed by: Caren Macadamarter, Jenne Sellinger W, CRNA Pre-anesthesia Checklist: Patient identified, Emergency Drugs available, Suction available and Patient being monitored Patient Re-evaluated:Patient Re-evaluated prior to induction Oxygen Delivery Method: Circle system utilized Preoxygenation: Pre-oxygenation with 100% oxygen Induction Type: IV induction Ventilation: Mask ventilation without difficulty Laryngoscope Size: Miller and 2 Grade View: Grade I Tube type: Oral Tube size: 7.0 mm Number of attempts: 1 Airway Equipment and Method: Stylet and Oral airway Placement Confirmation: ETT inserted through vocal cords under direct vision,  positive ETCO2 and breath sounds checked- equal and bilateral Secured at: 25 cm Tube secured with: Tape Dental Injury: Teeth and Oropharynx as per pre-operative assessment

## 2017-05-22 NOTE — Anesthesia Postprocedure Evaluation (Signed)
Anesthesia Post Note  Patient: Paul Nicholson  Procedure(s) Performed: THYROGLOSSAL DUCT CYST EXCISION (N/A Neck)     Patient location during evaluation: PACU Anesthesia Type: General Level of consciousness: awake and alert Pain management: pain level controlled Vital Signs Assessment: post-procedure vital signs reviewed and stable Respiratory status: spontaneous breathing, nonlabored ventilation and respiratory function stable Cardiovascular status: blood pressure returned to baseline and stable Postop Assessment: no apparent nausea or vomiting Anesthetic complications: no    Last Vitals:  Vitals:   05/22/17 1300 05/22/17 1330  BP: 130/74 125/71  Pulse: 79 76  Resp: 11 14  Temp:  37.1 C  SpO2: 95% 98%    Last Pain:  Vitals:   05/22/17 1330  TempSrc:   PainSc: 3                  Cecile HearingStephen Edward Lezli Danek

## 2017-05-22 NOTE — Transfer of Care (Signed)
Immediate Anesthesia Transfer of Care Note  Patient: Paul Nicholson  Procedure(s) Performed: Dewitt HoesHYROGLOSSAL DUCT CYST EXCISION (N/A Neck)  Patient Location: PACU  Anesthesia Type:General  Level of Consciousness: awake  Airway & Oxygen Therapy: Patient Spontanous Breathing and Patient connected to face mask oxygen  Post-op Assessment: Report given to RN and Post -op Vital signs reviewed and stable  Post vital signs: Reviewed and stable  Last Vitals:  Vitals:   05/22/17 0919  BP: 122/65  Pulse: 72  Resp: 18  Temp: 36.9 C  SpO2: 99%    Last Pain:  Vitals:   05/22/17 0919  TempSrc: Oral  PainSc:          Complications: No apparent anesthesia complications

## 2017-05-22 NOTE — H&P (Signed)
Cc: Thyroglossal duct cyst  HPI: The patient is a 27 y/o male who presents today for evaluation of his thyroglossal duct cyst. [facility] The patient noted onset of throat and neck pain several weeks ago. He was seen at the ER multiple times with an eventual diagnosis of an infected thyroglossal duct cyst. The patient was treated with several courses of antibiotics with relief of his pain but the cyst is still enlarged. The patient has no prior knowledge of the cyst being present. He currently denies dysphagia or odynophagia. No previous ENT surgery is noted.   The patient's review of systems (constitutional, eyes, ENT, cardiovascular, respiratory, GI, musculoskeletal, skin, neurologic, psychiatric, endocrine, hematologic, allergic) is noted in the ROS questionnaire.  It is reviewed with the patient.   Family health history: None.  Major events: None.  Ongoing medical problems: None.  Social history: The patient is married. He denies the use of tobacco, alcohol or illegal drugs.   Exam General: Communicates without difficulty, well nourished, no acute distress. Head: Normocephalic, no evidence injury, no tenderness, facial buttresses intact without stepoff. Eyes: PERRL, EOMI. No scleral icterus, conjunctivae clear. Neuro: CN II exam reveals vision grossly intact.  No nystagmus at any point of gaze. Ears: Auricles well formed without lesions.  Ear canals are intact without mass or lesion.  No erythema or edema is appreciated.  The TMs are intact without fluid. Nose: External evaluation reveals normal support and skin without lesions.  Dorsum is intact.  Anterior rhinoscopy reveals healthy pink mucosa over anterior aspect of inferior turbinates and intact septum.  No purulence noted. Oral:  Oral cavity and oropharynx are intact, symmetric, without erythema or edema.  Mucosa is moist without lesions. Neck: Full range of motion without pain.  There  is no significant lymphadenopathy.  A 2cm mass at the level of the hyoid bone.  Thyroid bed within normal limits to palpation.  Parotid glands and submandibular glands equal bilaterally without mass.  Trachea is midline.   Assessment A 2cm thyroglossal duct cyst with history of recent infections.   Plan  1. The physical exam findings are reviewed with the patient.  2. Excision of the cyst is recommended. The risks, benefits, alternatives, and details of the procedure are reviewed with the patient. Questions are invited and answered. 3. The patient is interested in proceeding with the procedure.  We will schedule the procedure in accordance with the family schedule.

## 2017-05-24 ENCOUNTER — Encounter (HOSPITAL_BASED_OUTPATIENT_CLINIC_OR_DEPARTMENT_OTHER): Payer: Self-pay | Admitting: Otolaryngology

## 2017-05-28 ENCOUNTER — Ambulatory Visit (INDEPENDENT_AMBULATORY_CARE_PROVIDER_SITE_OTHER): Payer: Medicaid Other | Admitting: Otolaryngology

## 2017-06-11 ENCOUNTER — Ambulatory Visit (INDEPENDENT_AMBULATORY_CARE_PROVIDER_SITE_OTHER): Payer: Medicaid Other | Admitting: Otolaryngology

## 2017-10-16 ENCOUNTER — Encounter

## 2018-02-15 ENCOUNTER — Ambulatory Visit (HOSPITAL_COMMUNITY)
Admission: EM | Admit: 2018-02-15 | Discharge: 2018-02-15 | Disposition: A | Discharge status: Home / Self Care | Payer: Medicaid Other | Attending: Family Medicine | Admitting: Family Medicine

## 2018-02-15 ENCOUNTER — Encounter (HOSPITAL_COMMUNITY): Payer: Self-pay | Admitting: Family Medicine

## 2018-02-15 ENCOUNTER — Ambulatory Visit (INDEPENDENT_AMBULATORY_CARE_PROVIDER_SITE_OTHER): Payer: Medicaid Other

## 2018-02-15 ENCOUNTER — Other Ambulatory Visit: Payer: Self-pay

## 2018-02-15 DIAGNOSIS — S6000XA Contusion of unspecified finger without damage to nail, initial encounter: Secondary | ICD-10-CM

## 2018-02-15 NOTE — ED Provider Notes (Signed)
[facility]    CSN: [id number] Arrival date & time: 02/15/18  1858     History   Chief Complaint No chief complaint on file.   HPI Paul Nicholson is a 27 y.o. male.   right hand pain after pain on the brakes of his car today at home.  He was using a wrench when it slipped and he may have struck his lateral hand.  Patient has a history of injury to that hand, although he does not think it was a fracture at the time.  It seemed to heal after it was splinted for several weeks.  Patient notes that he has tenderness over the fifth metacarpal with numbness and tingling up the ulna and the forearm.     Past Medical History:  Diagnosis Date  . 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 Eczema of face   . 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   Past Surgical History:  Procedure Laterality Date  . NO PAST SURGERIES    . THYROGLOSSAL DUCT CYST N/A 05/22/2017   Procedure: THYROGLOSSAL DUCT CYST EXCISION;  Surgeon: Newman Pies, MD;  Location: [facility];  Service: ENT;  Laterality: N/A;       Home Medications    Prior to Admission medications   Medication Sig Start Date End Date Taking? Authorizing Provider  clindamycin (CLEOCIN) 150 MG capsule Take 150 mg by mouth 4 (four) times daily. 05/12/17   [provider]  Hydrocodone-Acetaminophen 5-300 MG TABS Take 1 tablet by mouth every 4 (four) hours as needed (pain). 05/22/17   Newman Pies, MD  Multiple Vitamin (MULTIVITAMIN) tablet Take 1 tablet by mouth daily.    [provider]    Family History History reviewed. No pertinent family history.  Social History Social History   Tobacco Use  . Smoking status: Never Smoker  . Smokeless tobacco: Never Used  Substance Use Topics  . Alcohol use: No    Frequency: Never  . Drug use: No     Allergies   Fish allergy and Oxycodone   Review of Systems Review of  Systems   Physical Exam Triage Vital Signs ED Triage Vitals  Enc Vitals Group     BP      Pulse      Resp      Temp      Temp src      SpO2      Weight      Height      Head Circumference      Peak Flow      Pain Score      Pain Loc      Pain Edu?      Excl. in GC?    No data found.  Updated Vital Signs BP 131/72 (BP Location: Right Arm)   Pulse 79   Temp 98.5 F (36.9 C)   Resp 18   Wt 99.8 kg   SpO2 100%   BMI 34.46 kg/m     Physical Exam  Constitutional: He is oriented to person, place, and time. He appears well-developed and well-nourished.  HENT:  Right Ear: External ear normal.  Left Ear: External ear normal.  Eyes: Conjunctivae are normal.  Neck: Normal range of motion. Neck supple.  Pulmonary/Chest: Effort normal.  Musculoskeletal: He exhibits tenderness. He exhibits no deformity.  Tender and mildly swollen ulnar side of his right  hand with full range of motion of fingers and wrist.  Neurological: He is alert and oriented to person, place, and time.  Skin: Skin is warm and dry.  No ecchymosis seen on the hand  Nursing note and vitals reviewed.    UC Treatments / Results  Labs (all labs ordered are listed, but only abnormal results are displayed) Labs Reviewed - No data to display  EKG None  Radiology Dg Hand Complete Right  Result Date: 02/15/2018 CLINICAL DATA:  Right fifth metacarpal pain and swelling after using a wrench to change brakes. Initial encounter. EXAM: RIGHT HAND - COMPLETE 3+ VIEW COMPARISON:  None. FINDINGS: Mild soft tissue swelling is noted along the ulnar aspect of the hand. No fracture, dislocation, or radiopaque foreign body is identified. IMPRESSION: Soft tissue swelling without evidence of acute osseous abnormality. Electronically Signed   By: Sebastian Ache M.D.   On: 02/15/2018 19:48    Procedures Procedures (including critical care time)  Medications Ordered in UC Medications - No data to display  Initial  Impression / Assessment and Plan / UC Course  I have reviewed the triage vital signs and the nursing notes.  Pertinent labs & imaging results that were available during my care of the patient were reviewed by me and considered in my medical decision making (see chart for details).    Final Clinical Impressions(s) / UC Diagnoses   Final diagnoses:  None   Discharge Instructions   None    ED Prescriptions    None     Controlled Substance Prescriptions [place] Controlled Substance Registry consulted? Not Applicable   Elvina Sidle, MD 02/15/18 1958

## 2018-02-15 NOTE — ED Triage Notes (Signed)
Pt states he injured his right hand.

## 2018-07-31 ENCOUNTER — Other Ambulatory Visit: Payer: Self-pay

## 2018-07-31 ENCOUNTER — Ambulatory Visit (HOSPITAL_COMMUNITY)
Admission: EM | Admit: 2018-07-31 | Discharge: 2018-07-31 | Disposition: A | Discharge status: Home / Self Care | Payer: 59 | Attending: Family Medicine | Admitting: Family Medicine

## 2018-07-31 ENCOUNTER — Encounter (HOSPITAL_COMMUNITY): Payer: Self-pay | Admitting: Emergency Medicine

## 2018-07-31 DIAGNOSIS — T7840XA Allergy, unspecified, initial encounter: Secondary | ICD-10-CM | POA: Diagnosis not present

## 2018-07-31 DIAGNOSIS — R05 Cough: Secondary | ICD-10-CM

## 2018-07-31 DIAGNOSIS — M62838 Other muscle spasm: Secondary | ICD-10-CM

## 2018-07-31 DIAGNOSIS — R0981 Nasal congestion: Secondary | ICD-10-CM

## 2018-07-31 DIAGNOSIS — R0982 Postnasal drip: Secondary | ICD-10-CM

## 2018-07-31 DIAGNOSIS — M62830 Muscle spasm of back: Secondary | ICD-10-CM

## 2018-07-31 DIAGNOSIS — M5489 Other dorsalgia: Secondary | ICD-10-CM

## 2018-07-31 MED ORDER — KETOROLAC TROMETHAMINE 60 MG/2ML IM SOLN
60.0000 mg | Freq: Once | INTRAMUSCULAR | Status: AC
  Administered 2018-07-31: 60 mg via INTRAMUSCULAR

## 2018-07-31 MED ORDER — CYCLOBENZAPRINE HCL 10 MG PO TABS: 10.0000 mg | ORAL_TABLET | Freq: Two times a day (BID) | ORAL | 0 refills | Status: DC | PRN

## 2018-07-31 MED ORDER — CETIRIZINE HCL 10 MG PO TABS: 10.0000 mg | ORAL_TABLET | Freq: Every day | ORAL | 0 refills | Status: DC

## 2018-07-31 MED ORDER — KETOROLAC TROMETHAMINE 60 MG/2ML IM SOLN
INTRAMUSCULAR | Status: AC
  Filled 2018-07-31: qty 2

## 2018-07-31 NOTE — Discharge Instructions (Addendum)
I believe that your symptoms are related to allergies Try some Zyrtec daily for symptoms There is no concerning signs or symptoms for flu or coronavirus  You had a muscle spasm in your back I am giving you a muscle ask her to help with this. Try the exercises that we talked about to help Follow up as needed for continued or worsening symptoms

## 2018-07-31 NOTE — ED Triage Notes (Signed)
Pt c/o dry cough for several days, pt unsure if its allergies. Pt c/o pain on the L side of his spine in the lower lumbar area. Sharp pain only in the mornings x1 week.

## 2018-07-31 NOTE — ED Provider Notes (Signed)
[facility]    CSN: [id number] Arrival date & time: 07/31/18  3149     History   Chief Complaint Chief Complaint  Patient presents with  . Cough  . Back Pain    HPI Paul Nicholson is a 28 y.o. male.   Pt is a 28 year old male that presents with dry cough, nasal congestion, scratchy throat with PND. This has been constant over the past 3 days. Denies any hx of allergies.  He has not take anything to treat his symptoms.  He denies any associated fevers, chills, bodies, night sweats, chest pain or shortness of breath.  No recent traveling or sick contacts.  Patient also here complaining of left upper back pain.  Describes his pain as sharp, tightness and worse with movements.  The pain is worse in the morning upon awakening.  He is been using ibuprofen with some mild relief of pain.  Denies any recent injuries to the back or heavy lifting.  He recently moved and did a lot of bending, stooping and twisting picking up heavy boxes.  Denies any numbness, tingling or weakness.  ROS per HPI    Cough  Back Pain    Past Medical History:  Diagnosis Date  . 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 Eczema of face   . 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   Past Surgical History:  Procedure Laterality Date  . NO PAST SURGERIES    . THYROGLOSSAL DUCT CYST N/A 05/22/2017   Procedure: THYROGLOSSAL DUCT CYST EXCISION;  Surgeon: Newman Pies, MD;  Location: [facility];  Service: ENT;  Laterality: N/A;       Home Medications    Prior to Admission medications   Medication Sig Start Date End Date Taking? Authorizing Provider  cetirizine (ZYRTEC) 10 MG tablet Take 1 tablet (10 mg total) by mouth daily. 07/31/18   Dahlia Byes A, NP  cyclobenzaprine (FLEXERIL) 10 MG tablet Take 1 tablet (10 mg total) by mouth 2 (two) times daily as needed for muscle spasms. 07/31/18   Dahlia Byes A, NP  Multiple Vitamin  (MULTIVITAMIN) tablet Take 1 tablet by mouth daily.    [provider]    Family History No family history on file.  Social History Social History   Tobacco Use  . Smoking status: Never Smoker  . Smokeless tobacco: Never Used  Substance Use Topics  . Alcohol use: No    Frequency: Never  . Drug use: No     Allergies   Fish allergy and Oxycodone   Review of Systems Review of Systems  Respiratory: Positive for cough.   Musculoskeletal: Positive for back pain.     Physical Exam Triage Vital Signs ED Triage Vitals  Enc Vitals Group     BP 07/31/18 1002 127/65     Pulse Rate 07/31/18 1002 75     Resp 07/31/18 1002 16     Temp 07/31/18 1002 97.7 F (36.5 C)     Temp Source 07/31/18 1002 Oral     SpO2 07/31/18 1002 100 %     Weight --      Height --      Head Circumference --      Peak Flow --      Pain Score 07/31/18 1004 8     Pain Loc --      Pain Edu? --  Excl. in GC? --    No data found.  Updated Vital Signs BP 127/65 (BP Location: Left Arm)   Pulse 75   Temp 97.7 F (36.5 C) (Oral)   Resp 16   SpO2 100%   Visual Acuity Right Eye Distance:   Left Eye Distance:   Bilateral Distance:    Right Eye Near:   Left Eye Near:    Bilateral Near:     Physical Exam Vitals signs and nursing note reviewed.  Constitutional:      General: He is not in acute distress.    Appearance: Normal appearance. He is well-developed. He is not ill-appearing, toxic-appearing or diaphoretic.  HENT:     Head: Normocephalic and atraumatic.     Right Ear: Tympanic membrane and ear canal normal.     Left Ear: Tympanic membrane and ear canal normal.     Nose: Congestion and rhinorrhea present.     Mouth/Throat:     Pharynx: Oropharynx is clear.  Eyes:     Conjunctiva/sclera: Conjunctivae normal.  Neck:     Musculoskeletal: Neck supple.  Cardiovascular:     Rate and Rhythm: Normal rate and regular rhythm.     Heart sounds: No murmur.  Pulmonary:      Effort: Pulmonary effort is normal. No respiratory distress.     Breath sounds: Normal breath sounds.  Musculoskeletal: Normal range of motion.        General: Tenderness present.       Arms:     Comments: Tenderness to thoracic paravertebral musculature with muscle spasm.  No bruising, erythema or deformities. No spinal tenderness No rashes  Lymphadenopathy:     Cervical: No cervical adenopathy.  Skin:    General: Skin is warm and dry.  Neurological:     Mental Status: He is alert.  Psychiatric:        Mood and Affect: Mood normal.      UC Treatments / Results  Labs (all labs ordered are listed, but only abnormal results are displayed) Labs Reviewed - No data to display  EKG None  Radiology No results found.  Procedures Procedures (including critical care time)  Medications Ordered in UC Medications  ketorolac (TORADOL) injection 60 mg (60 mg Intramuscular Given 07/31/18 1043)    Initial Impression / Assessment and Plan / UC Course  I have reviewed the triage vital signs and the nursing notes.  Pertinent labs & imaging results that were available during my care of the patient were reviewed by me and considered in my medical decision making (see chart for details).     Allergic rhinitis He can use Zyrtec for symptoms  Muscle spasm Toradol injection given here in clinic for pain and Flexeril sent to pharmacy for muscle relaxant Instructed on exercises to do Follow up as needed for continued or worsening symptoms  Final Clinical Impressions(s) / UC Diagnoses   Final diagnoses:  Allergic state, initial encounter  Muscle spasm     Discharge Instructions     I believe that your symptoms are related to allergies Try some Zyrtec daily for symptoms There is no concerning signs or symptoms for flu or coronavirus  You had a muscle spasm in your back I am giving you a muscle ask her to help with this. Try the exercises that we talked about to help Follow up  as needed for continued or worsening symptoms       ED Prescriptions    Medication Sig Dispense Auth. Provider  cyclobenzaprine (FLEXERIL) 10 MG tablet Take 1 tablet (10 mg total) by mouth 2 (two) times daily as needed for muscle spasms. 20 tablet Lujuana Kapler A, NP   cetirizine (ZYRTEC) 10 MG tablet Take 1 tablet (10 mg total) by mouth daily. 30 tablet Dahlia Byes A, NP     Controlled Substance Prescriptions [place] Controlled Substance Registry consulted? Not Applicable   Janace Aris, NP 07/31/18 1249

## 2019-02-03 ENCOUNTER — Encounter (HOSPITAL_COMMUNITY): Payer: Self-pay | Admitting: Emergency Medicine

## 2019-02-03 ENCOUNTER — Other Ambulatory Visit: Payer: Self-pay

## 2019-02-03 ENCOUNTER — Ambulatory Visit (HOSPITAL_COMMUNITY)
Admission: EM | Admit: 2019-02-03 | Discharge: 2019-02-03 | Disposition: A | Discharge status: Home / Self Care | Payer: 59 | Attending: Urgent Care | Admitting: Urgent Care

## 2019-02-03 DIAGNOSIS — X501XXA Overexertion from prolonged static or awkward postures, initial encounter: Secondary | ICD-10-CM

## 2019-02-03 DIAGNOSIS — M62830 Muscle spasm of back: Secondary | ICD-10-CM

## 2019-02-03 DIAGNOSIS — T148XXA Other injury of unspecified body region, initial encounter: Secondary | ICD-10-CM | POA: Diagnosis not present

## 2019-02-03 DIAGNOSIS — M546 Pain in thoracic spine: Secondary | ICD-10-CM | POA: Diagnosis not present

## 2019-02-03 DIAGNOSIS — S39012A Strain of muscle, fascia and tendon of lower back, initial encounter: Secondary | ICD-10-CM

## 2019-02-03 MED ORDER — CYCLOBENZAPRINE HCL 10 MG PO TABS: 10.0000 mg | ORAL_TABLET | Freq: Every day | ORAL | 0 refills | Status: AC

## 2019-02-03 MED ORDER — MELOXICAM 15 MG PO TABS: 15.0000 mg | ORAL_TABLET | Freq: Every day | ORAL | 0 refills | Status: AC

## 2019-02-03 MED ORDER — KETOROLAC TROMETHAMINE 60 MG/2ML IM SOLN
60.0000 mg | Freq: Once | INTRAMUSCULAR | Status: AC
  Administered 2019-02-03: 60 mg via INTRAMUSCULAR

## 2019-02-03 MED ORDER — KETOROLAC TROMETHAMINE 60 MG/2ML IM SOLN
INTRAMUSCULAR | Status: AC
  Filled 2019-02-03: qty 2

## 2019-02-03 NOTE — ED Triage Notes (Signed)
Pt here right mid back pain after twisting catching his wife who was falling today; pt sts worse with movement and inspiration

## 2019-02-03 NOTE — ED Provider Notes (Signed)
MRN: [id number] DOB: 1990-10-04  Subjective:   Paul Nicholson is a 28 y.o. male presenting for acute onset of mid to low back pain that is severe, constant and worsening.  Patient states that he suffered an injury today while trying to catch his wife from falling.  States that he twisted and contorted his on back to break her fall and is since had worsening pain.  He has tried ibuprofen with minimal relief.  Denies fever, nausea, vomiting, weakness, numbness or tingling, radicular symptoms, incontinence, difficulty urinating or defecating.   No current facility-administered medications for this encounter.   Current Outpatient Medications:  .  cetirizine (ZYRTEC) 10 MG tablet, Take 1 tablet (10 mg total) by mouth daily., Disp: 30 tablet, Rfl: 0 .  cyclobenzaprine (FLEXERIL) 10 MG tablet, Take 1 tablet (10 mg total) by mouth 2 (two) times daily as needed for muscle spasms. (Patient not taking: Reported on 02/03/2019), Disp: 20 tablet, Rfl: 0 .  Multiple Vitamin (MULTIVITAMIN) tablet, Take 1 tablet by mouth daily., Disp: , Rfl:     Allergies  Allergen Reactions  . Fish Allergy Hives    UNCOOKED SEAFOOD  . Oxycodone Itching    Past Medical History:  Diagnosis Date  . 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 Eczema of face   . 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 NO PAST SURGERIES    . THYROGLOSSAL DUCT CYST N/A 05/22/2017   Procedure: THYROGLOSSAL DUCT CYST EXCISION;  Surgeon: Newman Pies, MD;  Location: [facility];  Service: ENT;  Laterality: N/A;    ROS  Objective:   Vitals: BP (!) 145/88 (BP Location: Left Arm)   Pulse 88   Temp 98.2 F (36.8 C) (Temporal)   Resp 16   SpO2 99%   Physical Exam Constitutional:      Appearance: Normal appearance. He is well-developed and normal weight.  HENT:     Head: Normocephalic and atraumatic.     Right Ear: External ear normal.     Left Ear: External ear  normal.     Nose: Nose normal.     Mouth/Throat:     Pharynx: Oropharynx is clear.  Eyes:     Extraocular Movements: Extraocular movements intact.     Pupils: Pupils are equal, round, and reactive to light.  Cardiovascular:     Rate and Rhythm: Normal rate.  Pulmonary:     Effort: Pulmonary effort is normal.  Musculoskeletal:     Lumbar back: He exhibits decreased range of motion, tenderness (Over paraspinal muscles of the upper portions of lumbar region extending into thoracic region) and spasm (Severe from upper lumbar paraspinal muscles extending up to distal thoracic level). He exhibits no bony tenderness, no swelling, no edema and no deformity.  Neurological:     Mental Status: He is alert and oriented to person, place, and time.     Motor: No weakness (strength 5/5 for lower limbs).     Coordination: Coordination abnormal (moving gingerly favoring his low/mid back).     Deep Tendon Reflexes: Reflexes normal.  Psychiatric:        Mood and Affect: Mood normal.        Behavior: Behavior normal.     Assessment and Plan :   1. Strain of lumbar region, initial encounter   2. Muscle spasm of back   3. Muscle strain   4. Acute bilateral thoracic back pain  IM Toradol in clinic today.  We will have patient use combination of meloxicam and Flexeril for back/muscle strain.  Patient refused work note for couple of days off, emphasized need for work restrictions at the very least.  He was agreeable to this. Counseled patient on potential for adverse effects with medications prescribed/recommended today, ER and return-to-clinic precautions discussed, patient verbalized understanding.    Jaynee Eagles, [place] 02/03/19 2032

## 2019-02-10 ENCOUNTER — Ambulatory Visit (HOSPITAL_COMMUNITY)
Admission: EM | Admit: 2019-02-10 | Discharge: 2019-02-10 | Disposition: A | Discharge status: Home / Self Care | Payer: 59 | Attending: Family Medicine | Admitting: Family Medicine

## 2019-02-10 ENCOUNTER — Other Ambulatory Visit: Payer: Self-pay

## 2019-02-10 ENCOUNTER — Encounter (HOSPITAL_COMMUNITY): Payer: Self-pay

## 2019-02-10 DIAGNOSIS — Z0289 Encounter for other administrative examinations: Secondary | ICD-10-CM

## 2019-02-10 DIAGNOSIS — Z7689 Persons encountering health services in other specified circumstances: Secondary | ICD-10-CM

## 2019-02-10 NOTE — ED Provider Notes (Signed)
[facility]    CSN: [id number] Arrival date & time: 02/10/19  1924      History   Chief Complaint Chief Complaint  Patient presents with  . Letter for School/Work    HPI Paul Nicholson is a 28 y.o. male.   Paul Nicholson presents with requests for note to return to work. Was seen here last week due to a back injury, right sided thoracic back pain after he caught his wife after she slipped getting out of the car. He works in [place], therefore it was recommended to work in a light duty capacity. There was not available work for him to do, therefore he has been off since then. His pain has resolved, hasn't taken any medications in two days. Moving without difficulty without pain with bending or twisting.   Note from provider and out of work note reviewed from visit on 9/21.   ROS per HPI, negative if not otherwise mentioned.      Past Medical History:  Diagnosis Date  . 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 Eczema of face   . 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   Past Surgical History:  Procedure Laterality Date  . NO PAST SURGERIES    . THYROGLOSSAL DUCT CYST N/A 05/22/2017   Procedure: THYROGLOSSAL DUCT CYST EXCISION;  Surgeon: Newman Pies, MD;  Location: [facility];  Service: ENT;  Laterality: N/A;       Home Medications    Prior to Admission medications   Medication Sig Start Date End Date Taking? Authorizing Provider  cetirizine (ZYRTEC) 10 MG tablet Take 1 tablet (10 mg total) by mouth daily. 07/31/18   Dahlia Byes A, NP  cyclobenzaprine (FLEXERIL) 10 MG tablet Take 1 tablet (10 mg total) by mouth at bedtime. 02/03/19   Wallis Bamberg, PA-C  meloxicam (MOBIC) 15 MG tablet Take 1 tablet (15 mg total) by mouth daily. 02/03/19   Wallis Bamberg, PA-C  Multiple Vitamin (MULTIVITAMIN) tablet Take 1 tablet by mouth daily.    [provider]    Family History Family  History  Problem Relation Age of Onset  . Healthy Mother   . Healthy Father     Social History Social History   Tobacco Use  . Smoking status: Never Smoker  . Smokeless tobacco: Never Used  Substance Use Topics  . Alcohol use: Yes    Frequency: Never    Comment: holidays  . Drug use: No     Allergies   Fish allergy and Oxycodone   Review of Systems Review of Systems   Physical Exam Triage Vital Signs ED Triage Vitals  Enc Vitals Group     BP 02/10/19 1939 126/66     Pulse Rate 02/10/19 1939 79     Resp 02/10/19 1939 17     Temp 02/10/19 1939 98.6 F (37 C)     Temp Source 02/10/19 1939 Oral     SpO2 02/10/19 1939 99 %     Weight --      Height --      Head Circumference --      Peak Flow --      Pain Score 02/10/19 1937 0     Pain Loc --      Pain Edu? --      Excl. in GC? --    No data found.  Updated Vital Signs BP 126/66 (  BP Location: Left Arm)   Pulse 79   Temp 98.6 F (37 C) (Oral)   Resp 17   SpO2 99%    Physical Exam Constitutional:      Appearance: He is well-developed.  Cardiovascular:     Rate and Rhythm: Normal rate.  Pulmonary:     Effort: Pulmonary effort is normal.  Musculoskeletal:     Thoracic back: Normal.     Comments: Non tender on palpation; denies pain with flexion or rotation; full ROM of upper extremities without difficulty   Skin:    General: Skin is warm and dry.  Neurological:     Mental Status: He is alert and oriented to person, place, and time.      UC Treatments / Results  Labs (all labs ordered are listed, but only abnormal results are displayed) Labs Reviewed - No data to display  EKG   Radiology No results found.  Procedures Procedures (including critical care time)  Medications Ordered in UC Medications - No data to display  Initial Impression / Assessment and Plan / UC Course  I have reviewed the triage vital signs and the nursing notes.  Pertinent labs & imaging results that were  available during my care of the patient were reviewed by me and considered in my medical decision making (see chart for details).     Per previous provider note it was recommended 1 week of light duty. Patient has not required medication for pain in two days. No acute findings on exam. Ok to return to work. Follow up recommendations provided. Patient verbalized understanding and agreeable to plan.  Ambulatory out of clinic without difficulty.    Final Clinical Impressions(s) / UC Diagnoses   Final diagnoses:  Return to work evaluation     Discharge Instructions     Ok to return to work, I would still recommend being careful and not to over do it with any specific heavy lifting or twisting as able.  See exercises for strengthening of the back.  Please follow up with occupational health and/or your PCP as needed for recurrent or persistent symptoms.    ED Prescriptions    None     PDMP not reviewed this encounter.   Zigmund Gottron, NP 02/10/19 2001

## 2019-02-10 NOTE — ED Triage Notes (Signed)
Patient presents to Urgent Care with complaints of needing a note stating he is clear to go back to work since injuring his back last week. Patient reports he feels much better now.

## 2019-02-10 NOTE — Discharge Instructions (Signed)
Ok to return to work, I would still recommend being careful and not to over do it with any specific heavy lifting or twisting as able.  See exercises for strengthening of the back.  Please follow up with occupational health and/or your PCP as needed for recurrent or persistent symptoms.
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
## 2021-09-29 ENCOUNTER — Encounter: Payer: Self-pay | Admitting: Emergency Medicine

## 2021-09-29 ENCOUNTER — Ambulatory Visit
Admission: EM | Admit: 2021-09-29 | Discharge: 2021-09-29 | Disposition: A | Discharge status: Home / Self Care | Payer: Commercial Managed Care - PPO | Attending: Urgent Care | Admitting: Urgent Care

## 2021-09-29 DIAGNOSIS — R052 Subacute cough: Secondary | ICD-10-CM | POA: Diagnosis not present

## 2021-09-29 DIAGNOSIS — Z20818 Contact with and (suspected) exposure to other bacterial communicable diseases: Secondary | ICD-10-CM | POA: Diagnosis not present

## 2021-09-29 DIAGNOSIS — J029 Acute pharyngitis, unspecified: Secondary | ICD-10-CM

## 2021-09-29 LAB — POCT RAPID STREP A (OFFICE): Rapid Strep A Screen: NEGATIVE

## 2021-09-29 MED ORDER — AMOXICILLIN 500 MG PO CAPS: 500.0000 mg | ORAL_CAPSULE | Freq: Two times a day (BID) | ORAL | 0 refills | Status: AC

## 2021-09-29 MED ORDER — CETIRIZINE HCL 10 MG PO TABS: 10.0000 mg | ORAL_TABLET | Freq: Every day | ORAL | 0 refills | Status: AC

## 2021-09-29 MED ORDER — BENZONATATE 100 MG PO CAPS: 100.0000 mg | ORAL_CAPSULE | Freq: Three times a day (TID) | ORAL | 0 refills | Status: AC | PRN

## 2021-09-29 MED ORDER — PROMETHAZINE-DM 6.25-15 MG/5ML PO SYRP: 5.0000 mL | ORAL_SOLUTION | Freq: Every evening | ORAL | 0 refills | Status: AC | PRN

## 2021-09-29 MED ORDER — PSEUDOEPHEDRINE HCL 60 MG PO TABS: 60.0000 mg | ORAL_TABLET | Freq: Three times a day (TID) | ORAL | 0 refills | Status: AC | PRN

## 2021-09-29 NOTE — ED Triage Notes (Signed)
Pt here with exposure to strep and presents with body aches, chills, fevers, sore throat, headache x 2 days.

## 2021-09-29 NOTE — ED Provider Notes (Signed)
[facility]   MRN: [id number] DOB: [birth year]  Subjective:   Paul Nicholson is a 31 y.o. male presenting for 2-day history of throat pain, painful swallowing, fevers, chills, body aches, sinus headaches.  Has also been coughing.  Patient did have exposure to strep through one of the children.  No history of asthma.  Patient is not a smoker.  No chest pain, shortness of breath or wheezing.  Would also like a COVID test.  No current facility-administered medications for this encounter.  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   Eczema of face    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     Past Surgical History:  Procedure Laterality Date   NO PAST SURGERIES     THYROGLOSSAL DUCT CYST N/A 05/22/2017   Procedure: THYROGLOSSAL DUCT CYST EXCISION;  Surgeon: Newman Pies, MD;  Location: [facility];  Service: ENT;  Laterality: N/A;    Family History  Problem Relation Age of Onset   Healthy Mother    Healthy Father     Social History   Tobacco Use   Smoking status: Never   Smokeless tobacco: Never  Vaping Use   Vaping Use: Never used  Substance Use Topics   Alcohol use: Yes    Comment: holidays   Drug use: No    ROS   Objective:   Vitals: BP 134/76   Pulse 98   Temp 98.8 F (37.1 C)   Resp 20   SpO2 98%   Physical Exam Constitutional:      General:  He is not in acute distress.    Appearance: Normal appearance. He is well-developed and normal weight. He is not ill-appearing, toxic-appearing or diaphoretic.  HENT:     Head: Normocephalic and atraumatic.     Right Ear: External ear normal.     Left Ear: External ear normal.     Nose: Nose normal.     Mouth/Throat:     Mouth: Mucous membranes are moist.     Pharynx: Oropharyngeal exudate and posterior oropharyngeal erythema present. No pharyngeal swelling or uvula swelling.     Tonsils: No tonsillar exudate or tonsillar abscesses. 0 on the right. 0 on the left.     Comments: Associated postnasal drainage overlying pharynx. Eyes:     General: No scleral icterus.       Right eye: No discharge.        Left eye: No discharge.     Extraocular Movements: Extraocular movements intact.  Cardiovascular:     Rate and Rhythm: Normal rate and regular rhythm.     Heart sounds: Normal heart sounds. No murmur heard.   No friction rub. No gallop.  Pulmonary:     Effort: Pulmonary effort is normal. No respiratory distress.     Breath  sounds: Normal breath sounds. No stridor. No wheezing, rhonchi or rales.  Musculoskeletal:     Cervical back: Normal range of motion.  Neurological:     Mental Status: He is alert and oriented to person, place, and time.  Psychiatric:        Mood and Affect: Mood normal.        Behavior: Behavior normal.        Thought Content: Thought content normal.        Judgment: Judgment normal.   Rapid strep was negative.  Assessment and Plan :   PDMP not reviewed this encounter.  1. Acute pharyngitis, unspecified etiology   2. Strep throat exposure   3. Subacute cough     Will treat empirically for pharyngitis given physical exam findings and strep exposure.  Patient is to start amoxicillin, use supportive care otherwise.  COVID testing pending.  Counseled patient on potential for adverse effects with medications prescribed/recommended today, ER and return-to-clinic  precautions discussed, patient verbalized understanding.    Wallis Bamberg, PA-C 09/29/21 1250

## 2021-09-30 LAB — NOVEL CORONAVIRUS, NAA: SARS-CoV-2, NAA: DETECTED — AB

## 2022-06-27 ENCOUNTER — Emergency Department (HOSPITAL_COMMUNITY)
Admission: EM | Admit: 2022-06-27 | Discharge: 2022-06-27 | Disposition: A | Discharge status: Home / Self Care | Payer: Medicaid Other | Attending: Emergency Medicine | Admitting: Emergency Medicine

## 2022-06-27 ENCOUNTER — Emergency Department (HOSPITAL_COMMUNITY): Payer: Medicaid Other

## 2022-06-27 ENCOUNTER — Other Ambulatory Visit: Payer: Self-pay

## 2022-06-27 ENCOUNTER — Encounter (HOSPITAL_COMMUNITY): Payer: Self-pay

## 2022-06-27 DIAGNOSIS — S92254A Nondisplaced fracture of navicular [scaphoid] of right foot, initial encounter for closed fracture: Secondary | ICD-10-CM | POA: Diagnosis not present

## 2022-06-27 DIAGNOSIS — S99921A Unspecified injury of right foot, initial encounter: Secondary | ICD-10-CM | POA: Diagnosis present

## 2022-06-27 MED ORDER — HYDROCODONE-ACETAMINOPHEN 5-325 MG PO TABS
1.0000 | ORAL_TABLET | Freq: Once | ORAL | Status: AC
  Administered 2022-06-27: 1 via ORAL
  Filled 2022-06-27: qty 1

## 2022-06-27 MED ORDER — HYDROCODONE-ACETAMINOPHEN 5-325 MG PO TABS: 1.0000 | ORAL_TABLET | Freq: Four times a day (QID) | ORAL | 0 refills | Status: AC | PRN
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
## 2022-06-27 NOTE — Discharge Instructions (Addendum)
It was a pleasure taking care of you today!   Your x-ray showed fracture of the navicular bone on your right foot.    Attached is information for the on-call Orthopedist, call and set up a follow up appointment regarding todays ED visit. It is important that you call the orthopedist and inform them that you were seen in the ED to set up a follow-up appointment.    Wear the cam boot until you are evaluated by the orthopedist.  You may remove the cam boot at night.  Use your crutches to aid with walking.  Do not bear weight on this foot.  It is important that she use crutches at all times to help with walking.    You are prescribed Norco, take as prescribed.  Do not operate any heavy machinery or drive while taking this medication.  You may take over the counter 600 mg ibuprofen every 6 hours or for no more than 7 days. You may apply ice to the affected area for up to 15 minutes at a time.  Ensure to place a barrier between your skin and the ice.  Return to the Emergency Department if you are experiencing increasing/worsening symptoms.

## 2022-06-28 ENCOUNTER — Other Ambulatory Visit: Payer: Self-pay | Admitting: Orthopedic Surgery

## 2022-06-28 DIAGNOSIS — S92901A Unspecified fracture of right foot, initial encounter for closed fracture: Secondary | ICD-10-CM

## 2023-04-02 ENCOUNTER — Ambulatory Visit: Payer: 59

## 2023-04-02 ENCOUNTER — Ambulatory Visit
Admission: EM | Admit: 2023-04-02 | Discharge: 2023-04-02 | Disposition: A | Discharge status: Home / Self Care | Payer: 59

## 2023-04-02 DIAGNOSIS — M79672 Pain in left foot: Secondary | ICD-10-CM

## 2023-04-02 MED ORDER — NAPROXEN 500 MG PO TABS: 500.0000 mg | ORAL_TABLET | Freq: Two times a day (BID) | ORAL | 0 refills | Status: AC
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
## 2023-04-02 NOTE — Discharge Instructions (Signed)
You might have a possible mild non-displaced fracture. Therefore, I will splint you until we have an official reading. If the reading states there is no fracture, then you can remove the splint tomorrow. If there is fracture, we will have you follow up with a foot specialist. Information is below:  [facility] ([address] Podiatrist [place] COVID-19 info: [email or web address] Get online care: [email or web address] Address: [address] [place], [place], [place] [address] Phone: [phone] Appointments: [email or web address]   [facility], [place], [place] Doctor in [place], [place] Address: [address] [place], [place] [address] Phone: [phone]   Use naproxen twice daily with food for pain and inflammation.

## 2023-04-02 NOTE — ED Provider Notes (Signed)
[facility]  Note:  This document was prepared using Conservation officer, historic buildings and may include unintentional dictation errors.  MRN: [id number] DOB: May 19, 1990  Subjective:   Paul Nicholson is a 32 y.o. male presenting for 1 day history of acute onset persistent moderate to severe left foot pain with swelling, difficulty bearing weight. Symptoms started from walking into a door today. Has used ibuprofen.   No current facility-administered medications for this encounter.  Current Outpatient Medications:    ibuprofen (ADVIL) 200 MG tablet, Take 200 mg by mouth every 6 (six) hours as needed., Disp: , Rfl:    amoxicillin (AMOXIL) 500 MG capsule, Take 1 capsule (500 mg total) by mouth 2 (two) times daily., Disp: 20 capsule, Rfl: 0   benzonatate (TESSALON) 100 MG capsule, Take 1 capsule (100 mg total) by mouth 3 (three) times daily as needed for cough., Disp: 30 capsule, Rfl: 0   cetirizine (ZYRTEC) 10 MG tablet, Take 1 tablet (10 mg total) by mouth daily., Disp: 30 tablet, Rfl: 0   cyclobenzaprine (FLEXERIL) 10 MG tablet, Take 1 tablet (10 mg total) by mouth at bedtime., Disp: 30 tablet, Rfl: 0   HYDROcodone-acetaminophen (NORCO/VICODIN) 5-325 MG tablet, Take 1 tablet by mouth every 6 (six) hours as needed., Disp: 10 tablet, Rfl: 0   meloxicam (MOBIC) 15 MG tablet, Take 1 tablet (15 mg total) by mouth daily., Disp: 30 tablet, Rfl: 0   Multiple Vitamin (MULTIVITAMIN) tablet, Take 1 tablet by mouth daily., Disp: , Rfl:    promethazine-dextromethorphan (PROMETHAZINE-DM) 6.25-15 MG/5ML syrup, Take 5 mLs by mouth at bedtime as needed for cough., Disp: 100 mL, Rfl: 0   pseudoephedrine (SUDAFED) 60 MG tablet, Take 1 tablet (60 mg total) by mouth every 8 (eight) hours as needed for congestion., Disp: 30 tablet, Rfl: 0   Allergies  Allergen Reactions   Oxycodone Itching   Fish Allergy Hives    UNCOOKED SEAFOOD   Shellfish Allergy Hives    Past Medical History:   Diagnosis Date   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   Eczema of face    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     Past Surgical History:  Procedure Laterality Date   NO PAST SURGERIES     THYROGLOSSAL DUCT CYST N/A 05/22/2017   Procedure: THYROGLOSSAL DUCT CYST EXCISION;  Surgeon: Newman Pies, MD;  Location: [facility];  Service: ENT;  Laterality: N/A;    Family History  Problem Relation Age of Onset   Healthy Mother    Healthy Father     Social History   Tobacco Use   Smoking status: Never   Smokeless tobacco: Never  Vaping Use   Vaping status: Every Day  Substance Use Topics   Alcohol use: Yes    Comment: holidays   Drug use: No    ROS   Objective:   Vitals: BP 127/78 (BP Location: Left Arm)   Pulse 87   Temp 97.8 F (36.6 C) (Oral)   Resp 18   SpO2 95%   Physical Exam Constitutional:      General: He is not in acute distress.    Appearance: Normal appearance. He is well-developed and normal weight. He is not ill-appearing, toxic-appearing or diaphoretic.  HENT:     Head: Normocephalic and atraumatic.     Right Ear: External ear normal.     Left Ear: External ear normal.     Nose: Nose normal.  Mouth/Throat:     Pharynx: Oropharynx is clear.  Eyes:     General: No scleral icterus.       Right eye: No discharge.        Left eye: No discharge.     Extraocular Movements: Extraocular movements intact.  Cardiovascular:     Rate and Rhythm: Normal rate.  Pulmonary:     Effort: Pulmonary effort is normal.  Musculoskeletal:     Cervical back: Normal range of motion.       Feet:  Neurological:     Mental Status: He is alert and oriented to person, place, and time.  Psychiatric:        Mood and Affect: Mood normal.        Behavior: Behavior normal.        Thought Content: Thought content normal.        Judgment: Judgment normal.    Patient declined crutches. Placed into a short leg posterior splint with ankle at 90 degrees  flexion.   Assessment and Plan :   PDMP not reviewed this encounter.  1. Left foot pain     X-ray over-read was pending at time of discharge, recommended follow up with only abnormal results. Otherwise will not call for negative over-read. Patient was in agreement. Unfortunately, readings have not been received within a reasonable timeframe. Possible distal metatarsal fracture. Recommended splinting. Will follow up and update treatment plan as necessary with the over-read. Naproxen for pain and inflammation. Follow up with podiatry. Counseled patient on potential for adverse effects with medications prescribed/recommended today, ER and return-to-clinic precautions discussed, patient verbalized understanding.    Wallis Bamberg, [place] 04/02/23 4034
